# Patient Record
Sex: Male | Born: 1995 | Race: Black or African American | Hispanic: No | Marital: Single | Smoking: Never smoker
Health system: Southern US, Community
[De-identification: ages and names within clinical notes are randomized; demographics above are authoritative.]

---
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
## 2013-01-15 ENCOUNTER — Emergency Department: Payer: Self-pay | Admitting: Emergency Medicine

## 2014-07-20 ENCOUNTER — Emergency Department: Payer: Self-pay | Admitting: Internal Medicine

## 2015-11-09 ENCOUNTER — Emergency Department: Payer: No Typology Code available for payment source

## 2015-11-09 ENCOUNTER — Emergency Department
Admission: EM | Admit: 2015-11-09 | Discharge: 2015-11-09 | Disposition: A | Discharge status: Home / Self Care | Payer: No Typology Code available for payment source | Attending: Emergency Medicine | Admitting: Emergency Medicine

## 2015-11-09 ENCOUNTER — Encounter: Payer: Self-pay | Admitting: Emergency Medicine

## 2015-11-09 DIAGNOSIS — Y9389 Activity, other specified: Secondary | ICD-10-CM | POA: Diagnosis not present

## 2015-11-09 DIAGNOSIS — S060X9A Concussion with loss of consciousness of unspecified duration, initial encounter: Secondary | ICD-10-CM | POA: Diagnosis not present

## 2015-11-09 DIAGNOSIS — Y998 Other external cause status: Secondary | ICD-10-CM | POA: Diagnosis not present

## 2015-11-09 DIAGNOSIS — Y92410 Unspecified street and highway as the place of occurrence of the external cause: Secondary | ICD-10-CM | POA: Diagnosis not present

## 2015-11-09 MED ORDER — KETOROLAC TROMETHAMINE 60 MG/2ML IM SOLN: 60.0000 mg | Freq: Once | INTRAMUSCULAR | Status: DC

## 2015-11-09 MED ORDER — IBUPROFEN 400 MG PO TABS: 600.0000 mg | ORAL_TABLET | Freq: Once | ORAL | Status: DC

## 2015-11-09 MED ORDER — IBUPROFEN 800 MG PO TABS
800.0000 mg | ORAL_TABLET | Freq: Once | ORAL | Status: AC
  Administered 2015-11-09: 800 mg via ORAL

## 2015-11-09 MED ORDER — KETOROLAC TROMETHAMINE 60 MG/2ML IM SOLN
INTRAMUSCULAR | Status: AC
  Filled 2015-11-09: qty 2

## 2015-11-09 MED ORDER — IBUPROFEN 600 MG PO TABS: 600.0000 mg | ORAL_TABLET | Freq: Three times a day (TID) | ORAL | Status: DC | PRN

## 2015-11-09 NOTE — Discharge Instructions (Signed)
Please seek medical attention for any high fevers, chest pain, shortness of breath, change in behavior, persistent vomiting, bloody stool or any other new or concerning symptoms. ° ° °Concussion, Adult °A concussion, or closed-head injury, is a brain injury caused by a direct blow to the head or by a quick and sudden movement (jolt) of the head or neck. Concussions are usually not life-threatening. Even so, the effects of a concussion can be serious. If you have had a concussion before, you are more likely to experience concussion-like symptoms after a direct blow to the head.  °CAUSES °· Direct blow to the head, such as from running into another player during a soccer game, being hit in a fight, or hitting your head on a hard surface. °· A jolt of the head or neck that causes the brain to move back and forth inside the skull, such as in a car crash. °SIGNS AND SYMPTOMS °The signs of a concussion can be hard to notice. Early on, they may be missed by you, family members, and health care providers. You may look fine but act or feel differently. °Symptoms are usually temporary, but they may last for days, weeks, or even longer. Some symptoms may appear right away while others may not show up for hours or days. Every head injury is different. Symptoms include: °· Mild to moderate headaches that will not go away. °· A feeling of pressure inside your head. °· Having more trouble than usual: °¨ Learning or remembering things you have heard. °¨ Answering questions. °¨ Paying attention or concentrating. °¨ Organizing daily tasks. °¨ Making decisions and solving problems. °· Slowness in thinking, acting or reacting, speaking, or reading. °· Getting lost or being easily confused. °· Feeling tired all the time or lacking energy (fatigued). °· Feeling drowsy. °· Sleep disturbances. °¨ Sleeping more than usual. °¨ Sleeping less than usual. °¨ Trouble falling asleep. °¨ Trouble sleeping (insomnia). °· Loss of balance or feeling  lightheaded or dizzy. °· Nausea or vomiting. °· Numbness or tingling. °· Increased sensitivity to: °¨ Sounds. °¨ Lights. °¨ Distractions. °· Vision problems or eyes that tire easily. °· Diminished sense of taste or smell. °· Ringing in the ears. °· Mood changes such as feeling sad or anxious. °· Becoming easily irritated or angry for little or no reason. °· Lack of motivation. °· Seeing or hearing things other people do not see or hear (hallucinations). °DIAGNOSIS °Your health care provider can usually diagnose a concussion based on a description of your injury and symptoms. He or she will ask whether you passed out (lost consciousness) and whether you are having trouble remembering events that happened right before and during your injury. °Your evaluation might include: °· A brain scan to look for signs of injury to the brain. Even if the test shows no injury, you may still have a concussion. °· Blood tests to be sure other problems are not present. °TREATMENT °· Concussions are usually treated in an emergency department, in urgent care, or at a clinic. You may need to stay in the hospital overnight for further treatment. °· Tell your health care provider if you are taking any medicines, including prescription medicines, over-the-counter medicines, and natural remedies. Some medicines, such as blood thinners (anticoagulants) and aspirin, may increase the chance of complications. Also tell your health care provider whether you have had alcohol or are taking illegal drugs. This information may affect treatment. °· Your health care provider will send you home with important instructions to   follow. °· How fast you will recover from a concussion depends on many factors. These factors include how severe your concussion is, what part of your brain was injured, your age, and how healthy you were before the concussion. °· Most people with mild injuries recover fully. Recovery can take time. In general, recovery is slower in  older persons. Also, persons who have had a concussion in the past or have other medical problems may find that it takes longer to recover from their current injury. °HOME CARE INSTRUCTIONS °General Instructions °· Carefully follow the directions your health care provider gave you. °· Only take over-the-counter or prescription medicines for pain, discomfort, or fever as directed by your health care provider. °· Take only those medicines that your health care provider has approved. °· Do not drink alcohol until your health care provider says you are well enough to do so. Alcohol and certain other drugs may slow your recovery and can put you at risk of further injury. °· If it is harder than usual to remember things, write them down. °· If you are easily distracted, try to do one thing at a time. For example, do not try to watch TV while fixing dinner. °· Talk with family members or close friends when making important decisions. °· Keep all follow-up appointments. Repeated evaluation of your symptoms is recommended for your recovery. °· Watch your symptoms and tell others to do the same. Complications sometimes occur after a concussion. Older adults with a brain injury may have a higher risk of serious complications, such as a blood clot on the brain. °· Tell your teachers, school nurse, school counselor, coach, athletic trainer, or work manager about your injury, symptoms, and restrictions. Tell them about what you can or cannot do. They should watch for: °¨ Increased problems with attention or concentration. °¨ Increased difficulty remembering or learning new information. °¨ Increased time needed to complete tasks or assignments. °¨ Increased irritability or decreased ability to cope with stress. °¨ Increased symptoms. °· Rest. Rest helps the brain to heal. Make sure you: °¨ Get plenty of sleep at night. Avoid staying up late at night. °¨ Keep the same bedtime hours on weekends and weekdays. °¨ Rest during the day.  Take daytime naps or rest breaks when you feel tired. °· Limit activities that require a lot of thought or concentration. These include: °¨ Doing homework or job-related work. °¨ Watching TV. °¨ Working on the computer. °· Avoid any situation where there is potential for another head injury (football, hockey, soccer, basketball, martial arts, downhill snow sports and horseback riding). Your condition will get worse every time you experience a concussion. You should avoid these activities until you are evaluated by the appropriate follow-up health care providers. °Returning To Your Regular Activities °You will need to return to your normal activities slowly, not all at once. You must give your body and brain enough time for recovery. °· Do not return to sports or other athletic activities until your health care provider tells you it is safe to do so. °· Ask your health care provider when you can drive, ride a bicycle, or operate heavy machinery. Your ability to react may be slower after a brain injury. Never do these activities if you are dizzy. °· Ask your health care provider about when you can return to work or school. °Preventing Another Concussion °It is very important to avoid another brain injury, especially before you have recovered. In rare cases, another injury can lead   to permanent brain damage, brain swelling, or death. The risk of this is greatest during the first 7-10 days after a head injury. Avoid injuries by: °· Wearing a seat belt when riding in a car. °· Drinking alcohol only in moderation. °· Wearing a helmet when biking, skiing, skateboarding, skating, or doing similar activities. °· Avoiding activities that could lead to a second concussion, such as contact or recreational sports, until your health care provider says it is okay. °· Taking safety measures in your home. °¨ Remove clutter and tripping hazards from floors and stairways. °¨ Use grab bars in bathrooms and handrails by stairs. °¨ Place  non-slip mats on floors and in bathtubs. °¨ Improve lighting in dim areas. °SEEK MEDICAL CARE IF: °· You have increased problems paying attention or concentrating. °· You have increased difficulty remembering or learning new information. °· You need more time to complete tasks or assignments than before. °· You have increased irritability or decreased ability to cope with stress. °· You have more symptoms than before. °Seek medical care if you have any of the following symptoms for more than 2 weeks after your injury: °· Lasting (chronic) headaches. °· Dizziness or balance problems. °· Nausea. °· Vision problems. °· Increased sensitivity to noise or light. °· Depression or mood swings. °· Anxiety or irritability. °· Memory problems. °· Difficulty concentrating or paying attention. °· Sleep problems. °· Feeling tired all the time. °SEEK IMMEDIATE MEDICAL CARE IF: °· You have severe or worsening headaches. These may be a sign of a blood clot in the brain. °· You have weakness (even if only in one hand, leg, or part of the face). °· You have numbness. °· You have decreased coordination. °· You vomit repeatedly. °· You have increased sleepiness. °· One pupil is larger than the other. °· You have convulsions. °· You have slurred speech. °· You have increased confusion. This may be a sign of a blood clot in the brain. °· You have increased restlessness, agitation, or irritability. °· You are unable to recognize people or places. °· You have neck pain. °· It is difficult to wake you up. °· You have unusual behavior changes. °· You lose consciousness. °MAKE SURE YOU: °· Understand these instructions. °· Will watch your condition. °· Will get help right away if you are not doing well or get worse. °  °This information is not intended to replace advice given to you by your health care provider. Make sure you discuss any questions you have with your health care provider. °  °Document Released: 10/20/2003 Document Revised:  08/20/2014 Document Reviewed: 02/19/2013 °Elsevier Interactive Patient Education ©2016 [employer] ° °
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
## 2015-11-09 NOTE — ED Notes (Signed)
AAOx3.  Skin warm and dry.  NAD 

## 2015-11-09 NOTE — ED Notes (Signed)
AAOx3.  Skin warm and dry. Ambulates with easy and steady gait.  MOving all extremities equally and strong.  NAD.

## 2016-02-07 ENCOUNTER — Emergency Department
Admission: EM | Admit: 2016-02-07 | Discharge: 2016-02-07 | Disposition: A | Discharge status: Home / Self Care | Payer: No Typology Code available for payment source | Attending: Emergency Medicine | Admitting: Emergency Medicine

## 2016-02-07 ENCOUNTER — Emergency Department: Payer: No Typology Code available for payment source

## 2016-02-07 DIAGNOSIS — Y92410 Unspecified street and highway as the place of occurrence of the external cause: Secondary | ICD-10-CM | POA: Diagnosis not present

## 2016-02-07 DIAGNOSIS — Y999 Unspecified external cause status: Secondary | ICD-10-CM | POA: Diagnosis not present

## 2016-02-07 DIAGNOSIS — S161XXA Strain of muscle, fascia and tendon at neck level, initial encounter: Secondary | ICD-10-CM | POA: Diagnosis not present

## 2016-02-07 DIAGNOSIS — F1290 Cannabis use, unspecified, uncomplicated: Secondary | ICD-10-CM | POA: Insufficient documentation

## 2016-02-07 DIAGNOSIS — Y9389 Activity, other specified: Secondary | ICD-10-CM | POA: Diagnosis not present

## 2016-02-07 DIAGNOSIS — S199XXA Unspecified injury of neck, initial encounter: Secondary | ICD-10-CM | POA: Diagnosis present

## 2016-02-07 MED ORDER — IBUPROFEN 600 MG PO TABS
600.0000 mg | ORAL_TABLET | Freq: Once | ORAL | Status: AC
  Administered 2016-02-07: 600 mg via ORAL
  Filled 2016-02-07: qty 1

## 2016-02-07 MED ORDER — TRAMADOL HCL 50 MG PO TABS: 50.0000 mg | ORAL_TABLET | Freq: Four times a day (QID) | ORAL | Status: DC | PRN

## 2016-02-07 MED ORDER — IBUPROFEN 600 MG PO TABS: 600.0000 mg | ORAL_TABLET | Freq: Three times a day (TID) | ORAL | Status: DC | PRN

## 2016-02-07 MED ORDER — METHOCARBAMOL 500 MG PO TABS
1000.0000 mg | ORAL_TABLET | Freq: Once | ORAL | Status: AC
  Administered 2016-02-07: 1000 mg via ORAL
  Filled 2016-02-07: qty 2

## 2016-02-07 MED ORDER — METHOCARBAMOL 750 MG PO TABS: 750.0000 mg | ORAL_TABLET | Freq: Four times a day (QID) | ORAL | Status: DC

## 2016-02-07 MED ORDER — OXYCODONE-ACETAMINOPHEN 5-325 MG PO TABS
1.0000 | ORAL_TABLET | Freq: Once | ORAL | Status: AC
  Administered 2016-02-07: 1 via ORAL
  Filled 2016-02-07: qty 1
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
## 2016-02-07 NOTE — Discharge Instructions (Signed)
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
## 2019-03-11 ENCOUNTER — Ambulatory Visit: Payer: Self-pay

## 2019-03-11 ENCOUNTER — Other Ambulatory Visit: Payer: Self-pay

## 2019-03-11 DIAGNOSIS — Z202 Contact with and (suspected) exposure to infections with a predominantly sexual mode of transmission: Secondary | ICD-10-CM

## 2019-03-11 DIAGNOSIS — Z113 Encounter for screening for infections with a predominantly sexual mode of transmission: Secondary | ICD-10-CM

## 2019-03-11 MED ORDER — METRONIDAZOLE 500 MG PO TABS: 2000.0000 mg | ORAL_TABLET | Freq: Once | ORAL | Status: DC

## 2019-03-11 MED ORDER — METRONIDAZOLE 500 MG PO TABS: 500.0000 mg | ORAL_TABLET | Freq: Once | ORAL | 0 refills | Status: AC

## 2019-03-11 NOTE — Progress Notes (Signed)
    STI clinic/screening visit  Subjective:  Tony Nelson is a 23 y.o. male being seen today for an STI screening visit. The patient reports they do have symptoms.  Patient has the following medical conditions:  There are no active problems to display for this patient.    Chief Complaint  Patient presents with  . SEXUALLY TRANSMITTED DISEASE    requests Trich tx    HPI  Patient reports thinks he has trich again.  States that he received "3" pills for his trich treatment instead of 4 recently.  He is feeling an intermittent tingling sensation with urination.  He is here for treatment only.  See flowsheet for further details and programmatic requirements.    The following portions of the patient's history were reviewed and updated as appropriate: allergies, current medications, past medical history, past social history, past surgical history and problem list.  Objective:  There were no vitals filed for this visit.  Physical Exam client declines exam    Assessment and Plan:  Tony Nelson is a 23 y.o. male presenting to the [facility] Department for STI screening  1. Screening examination for venereal disease Client declines testing and exam today - metroNIDAZOLE (FLAGYL) tablet 2,000 mg  2. Possible exposure to STD - metroNIDAZOLE (FLAGYL) tablet 2,000 mg Co. To abstain from sex x 1 wk.  Condoms always     No follow-ups on file.  No future appointments.  Hassell Done, FNP

## 2019-03-11 NOTE — Progress Notes (Signed)
Declines HIV/RPR and STD screen. Requests tx for Trich again d/t "tingling" that he is feeling. Aileen Fass, RN   Patient tx'd per C. Marzetta Board FNP order. Aileen Fass, RN

## 2020-04-02 ENCOUNTER — Other Ambulatory Visit: Payer: Self-pay

## 2020-04-02 ENCOUNTER — Emergency Department: Payer: No Typology Code available for payment source

## 2020-04-02 ENCOUNTER — Emergency Department
Admission: EM | Admit: 2020-04-02 | Discharge: 2020-04-02 | Disposition: A | Discharge status: Home / Self Care | Payer: No Typology Code available for payment source | Attending: Emergency Medicine | Admitting: Emergency Medicine

## 2020-04-02 DIAGNOSIS — R519 Headache, unspecified: Secondary | ICD-10-CM | POA: Insufficient documentation

## 2020-04-02 MED ORDER — NAPROXEN 500 MG PO TBEC: 500.0000 mg | DELAYED_RELEASE_TABLET | Freq: Two times a day (BID) | ORAL | 0 refills | Status: AC

## 2020-04-02 MED ORDER — ACETAMINOPHEN 500 MG PO TABS
1000.0000 mg | ORAL_TABLET | Freq: Once | ORAL | Status: AC
  Administered 2020-04-02: 1000 mg via ORAL
  Filled 2020-04-02: qty 2
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
## 2020-06-29 ENCOUNTER — Emergency Department
Admission: EM | Admit: 2020-06-29 | Discharge: 2020-06-29 | Disposition: A | Discharge status: Home / Self Care | Payer: Self-pay | Attending: Emergency Medicine | Admitting: Emergency Medicine

## 2020-06-29 ENCOUNTER — Other Ambulatory Visit: Payer: Self-pay

## 2020-06-29 ENCOUNTER — Encounter: Payer: Self-pay | Admitting: Emergency Medicine

## 2020-06-29 DIAGNOSIS — R519 Headache, unspecified: Secondary | ICD-10-CM | POA: Insufficient documentation

## 2020-06-29 MED ORDER — IBUPROFEN 600 MG PO TABS
600.0000 mg | ORAL_TABLET | Freq: Once | ORAL | Status: AC
  Administered 2020-06-29: 600 mg via ORAL
  Filled 2020-06-29: qty 1

## 2020-06-29 MED ORDER — IBUPROFEN 600 MG PO TABS: 600.0000 mg | ORAL_TABLET | Freq: Four times a day (QID) | ORAL | 0 refills | Status: DC | PRN
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
## 2022-06-19 ENCOUNTER — Ambulatory Visit: Payer: Self-pay

## 2022-08-29 ENCOUNTER — Emergency Department
Admission: EM | Admit: 2022-08-29 | Discharge: 2022-08-29 | Disposition: A | Discharge status: Home / Self Care | Payer: Self-pay | Attending: Emergency Medicine | Admitting: Emergency Medicine

## 2022-08-29 ENCOUNTER — Other Ambulatory Visit: Payer: Self-pay

## 2022-08-29 ENCOUNTER — Emergency Department: Payer: Self-pay

## 2022-08-29 DIAGNOSIS — S0990XA Unspecified injury of head, initial encounter: Secondary | ICD-10-CM | POA: Insufficient documentation

## 2022-08-29 DIAGNOSIS — R6884 Jaw pain: Secondary | ICD-10-CM | POA: Insufficient documentation

## 2022-08-29 MED ORDER — IBUPROFEN 600 MG PO TABS: 600.0000 mg | ORAL_TABLET | Freq: Four times a day (QID) | ORAL | 0 refills | Status: AC | PRN

## 2022-08-29 MED ORDER — ACETAMINOPHEN 500 MG PO TABS
1000.0000 mg | ORAL_TABLET | Freq: Once | ORAL | Status: AC
  Administered 2022-08-29: 1000 mg via ORAL
  Filled 2022-08-29: qty 2
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
## 2022-08-29 NOTE — Discharge Instructions (Addendum)
Your workup was reassuring and your CT scan was negative.  You can follow-up with your primary care doctor if you continue to have symptoms for 2 to 3 days for concussion screening.  Avoid television screens, prolonged use of your phone, avoid repeat injury, sports etc.  Tylenol 1 g every 8 hours and ibuprofen 600 with food to help with pain.  IMPRESSION:  1. No acute intracranial trauma.  2. Within the limits of patient motion artifact, no acute facial  fracture.
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
